# Patient Record
Sex: Male | Born: 1950 | Race: White | Hispanic: No | Marital: Married | Smoking: Never smoker
Health system: Southern US, Community
[De-identification: ages and names within clinical notes are randomized; demographics above are authoritative.]

## PROBLEM LIST (undated history)

## (undated) DIAGNOSIS — I10 Essential (primary) hypertension: Secondary | ICD-10-CM

## (undated) DIAGNOSIS — E785 Hyperlipidemia, unspecified: Secondary | ICD-10-CM

## (undated) HISTORY — PX: KIDNEY STONE SURGERY: SHX686

## (undated) HISTORY — PX: EYE SURGERY: SHX253

---

## 2002-09-23 ENCOUNTER — Ambulatory Visit (HOSPITAL_COMMUNITY): Admission: RE | Admit: 2002-09-23 | Discharge: 2002-09-23 | Payer: Self-pay | Admitting: Orthopedic Surgery

## 2002-09-23 ENCOUNTER — Encounter: Payer: Self-pay | Admitting: Orthopedic Surgery

## 2010-05-10 ENCOUNTER — Emergency Department (HOSPITAL_COMMUNITY)
Admission: EM | Admit: 2010-05-10 | Discharge: 2010-05-10 | Disposition: A | Discharge status: Home / Self Care | Payer: No Typology Code available for payment source | Attending: Emergency Medicine | Admitting: Emergency Medicine

## 2010-05-10 DIAGNOSIS — Z79899 Other long term (current) drug therapy: Secondary | ICD-10-CM | POA: Insufficient documentation

## 2010-05-10 DIAGNOSIS — R197 Diarrhea, unspecified: Secondary | ICD-10-CM | POA: Insufficient documentation

## 2010-05-10 DIAGNOSIS — I10 Essential (primary) hypertension: Secondary | ICD-10-CM | POA: Insufficient documentation

## 2010-05-10 DIAGNOSIS — E7800 Pure hypercholesterolemia, unspecified: Secondary | ICD-10-CM | POA: Insufficient documentation

## 2010-05-10 DIAGNOSIS — R55 Syncope and collapse: Secondary | ICD-10-CM | POA: Insufficient documentation

## 2010-05-10 DIAGNOSIS — R42 Dizziness and giddiness: Secondary | ICD-10-CM | POA: Insufficient documentation

## 2010-05-10 LAB — POCT I-STAT, CHEM 8
Glucose, Bld: 121 mg/dL — ABNORMAL HIGH (ref 70–99)
HCT: 39 % (ref 39.0–52.0)
Potassium: 3.7 mEq/L (ref 3.5–5.1)
TCO2: 21 mmol/L (ref 0–100)

## 2011-07-26 ENCOUNTER — Encounter (HOSPITAL_COMMUNITY): Payer: Self-pay | Admitting: Emergency Medicine

## 2011-07-26 ENCOUNTER — Emergency Department (HOSPITAL_COMMUNITY): Payer: No Typology Code available for payment source

## 2011-07-26 ENCOUNTER — Emergency Department (HOSPITAL_COMMUNITY)
Admission: EM | Admit: 2011-07-26 | Discharge: 2011-07-26 | Disposition: A | Discharge status: Home / Self Care | Payer: No Typology Code available for payment source | Attending: Emergency Medicine | Admitting: Emergency Medicine

## 2011-07-26 DIAGNOSIS — M549 Dorsalgia, unspecified: Secondary | ICD-10-CM | POA: Insufficient documentation

## 2011-07-26 DIAGNOSIS — I10 Essential (primary) hypertension: Secondary | ICD-10-CM | POA: Insufficient documentation

## 2011-07-26 DIAGNOSIS — R55 Syncope and collapse: Secondary | ICD-10-CM | POA: Insufficient documentation

## 2011-07-26 DIAGNOSIS — M25519 Pain in unspecified shoulder: Secondary | ICD-10-CM | POA: Insufficient documentation

## 2011-07-26 DIAGNOSIS — E785 Hyperlipidemia, unspecified: Secondary | ICD-10-CM | POA: Insufficient documentation

## 2011-07-26 HISTORY — DX: Essential (primary) hypertension: I10

## 2011-07-26 HISTORY — DX: Hyperlipidemia, unspecified: E78.5

## 2011-07-26 LAB — POCT I-STAT, CHEM 8
Calcium, Ion: 1.2 mmol/L (ref 1.12–1.32)
Chloride: 109 mEq/L (ref 96–112)
HCT: 42 % (ref 39.0–52.0)
Potassium: 3.6 mEq/L (ref 3.5–5.1)
Sodium: 145 mEq/L (ref 135–145)

## 2011-07-26 LAB — URINALYSIS, ROUTINE W REFLEX MICROSCOPIC
Glucose, UA: NEGATIVE mg/dL
Leukocytes, UA: NEGATIVE
Specific Gravity, Urine: 1.024 (ref 1.005–1.030)
pH: 5.5 (ref 5.0–8.0)

## 2011-07-26 LAB — CBC
Hemoglobin: 14.1 g/dL (ref 13.0–17.0)
MCH: 32.5 pg (ref 26.0–34.0)
MCV: 94.7 fL (ref 78.0–100.0)
RBC: 4.34 MIL/uL (ref 4.22–5.81)
WBC: 14.2 10*3/uL — ABNORMAL HIGH (ref 4.0–10.5)

## 2011-07-26 LAB — DIFFERENTIAL
Basophils Absolute: 0 10*3/uL (ref 0.0–0.1)
Eosinophils Relative: 3 % (ref 0–5)
Lymphocytes Relative: 12 % (ref 12–46)
Lymphs Abs: 1.6 10*3/uL (ref 0.7–4.0)

## 2011-07-26 LAB — GLUCOSE, CAPILLARY: Glucose-Capillary: 203 mg/dL — ABNORMAL HIGH (ref 70–99)

## 2011-07-26 MED ORDER — SODIUM CHLORIDE 0.9 % IV SOLN: INTRAVENOUS | Status: DC

## 2011-07-26 MED ORDER — SODIUM CHLORIDE 0.9 % IV SOLN
Freq: Once | INTRAVENOUS | Status: AC
  Administered 2011-07-26: 20:00:00 via INTRAVENOUS

## 2011-07-26 NOTE — ED Notes (Signed)
Hooked pt up to monitor and gave water .
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
## 2011-07-26 NOTE — ED Notes (Signed)
MD at bedside; PT not complaining of any pain; C collar and back board removed; pt very diaphoretic. Pt denies alcohol or drug use.

## 2011-07-26 NOTE — ED Notes (Signed)
PT ambulated with a steady gait; VSS; A&Ox3; no signs of distress; respirations even and unlabored; skin warm and dry; no questions.  

## 2011-07-26 NOTE — ED Notes (Signed)
Offered toileting and asked for urine sample. Pt states no need to urinate at present moment.

## 2011-07-26 NOTE — ED Notes (Signed)
Patient transported to X-ray 

## 2011-07-26 NOTE — ED Notes (Signed)
PT returned from XR; less diaphoretic; family at bedside.

## 2011-07-26 NOTE — ED Notes (Signed)
MD at bedside. 
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
## 2011-07-26 NOTE — ED Notes (Signed)
Urine specimen obtained.
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
## 2011-07-26 NOTE — ED Notes (Signed)
[employer] at bedside; pt is tearful but A&Ox3 with no signs of distress; family at bedside

## 2011-07-26 NOTE — ED Notes (Signed)
AIDET was performed. 

## 2014-11-01 ENCOUNTER — Encounter (HOSPITAL_BASED_OUTPATIENT_CLINIC_OR_DEPARTMENT_OTHER): Payer: Self-pay

## 2014-11-01 ENCOUNTER — Emergency Department (HOSPITAL_BASED_OUTPATIENT_CLINIC_OR_DEPARTMENT_OTHER): Payer: No Typology Code available for payment source

## 2014-11-01 ENCOUNTER — Emergency Department (HOSPITAL_BASED_OUTPATIENT_CLINIC_OR_DEPARTMENT_OTHER)
Admission: EM | Admit: 2014-11-01 | Discharge: 2014-11-01 | Disposition: A | Discharge status: Home / Self Care | Payer: No Typology Code available for payment source | Attending: Emergency Medicine | Admitting: Emergency Medicine

## 2014-11-01 DIAGNOSIS — Z79899 Other long term (current) drug therapy: Secondary | ICD-10-CM | POA: Insufficient documentation

## 2014-11-01 DIAGNOSIS — S32000A Wedge compression fracture of unspecified lumbar vertebra, initial encounter for closed fracture: Secondary | ICD-10-CM

## 2014-11-01 DIAGNOSIS — S32050A Wedge compression fracture of fifth lumbar vertebra, initial encounter for closed fracture: Secondary | ICD-10-CM | POA: Diagnosis not present

## 2014-11-01 DIAGNOSIS — W14XXXA Fall from tree, initial encounter: Secondary | ICD-10-CM | POA: Diagnosis not present

## 2014-11-01 DIAGNOSIS — Y990 Civilian activity done for income or pay: Secondary | ICD-10-CM | POA: Insufficient documentation

## 2014-11-01 DIAGNOSIS — Y9389 Activity, other specified: Secondary | ICD-10-CM | POA: Diagnosis not present

## 2014-11-01 DIAGNOSIS — I10 Essential (primary) hypertension: Secondary | ICD-10-CM | POA: Diagnosis not present

## 2014-11-01 DIAGNOSIS — Y9289 Other specified places as the place of occurrence of the external cause: Secondary | ICD-10-CM | POA: Diagnosis not present

## 2014-11-01 DIAGNOSIS — W19XXXA Unspecified fall, initial encounter: Secondary | ICD-10-CM

## 2014-11-01 DIAGNOSIS — E785 Hyperlipidemia, unspecified: Secondary | ICD-10-CM | POA: Insufficient documentation

## 2014-11-01 DIAGNOSIS — S3992XA Unspecified injury of lower back, initial encounter: Secondary | ICD-10-CM | POA: Diagnosis present

## 2014-11-01 LAB — BASIC METABOLIC PANEL
ANION GAP: 8 (ref 5–15)
BUN: 17 mg/dL (ref 6–20)
CALCIUM: 9.1 mg/dL (ref 8.9–10.3)
CO2: 26 mmol/L (ref 22–32)
Chloride: 106 mmol/L (ref 101–111)
Creatinine, Ser: 1.01 mg/dL (ref 0.61–1.24)
Glucose, Bld: 99 mg/dL (ref 65–99)
POTASSIUM: 3.9 mmol/L (ref 3.5–5.1)
SODIUM: 140 mmol/L (ref 135–145)

## 2014-11-01 LAB — URINALYSIS, ROUTINE W REFLEX MICROSCOPIC
Bilirubin Urine: NEGATIVE
GLUCOSE, UA: NEGATIVE mg/dL
Hgb urine dipstick: NEGATIVE
KETONES UR: NEGATIVE mg/dL
LEUKOCYTES UA: NEGATIVE
NITRITE: NEGATIVE
PROTEIN: NEGATIVE mg/dL
Specific Gravity, Urine: >1.046 — ABNORMAL HIGH (ref 1.005–1.030)
Urobilinogen, UA: 1 mg/dL (ref 0.0–1.0)
pH: 5 (ref 5.0–8.0)

## 2014-11-01 MED ORDER — ONDANSETRON HCL 4 MG/2ML IJ SOLN
4.0000 mg | Freq: Once | INTRAMUSCULAR | Status: AC
  Administered 2014-11-01: 4 mg via INTRAVENOUS
  Filled 2014-11-01: qty 2

## 2014-11-01 MED ORDER — HYDROMORPHONE HCL 1 MG/ML IJ SOLN
0.5000 mg | Freq: Once | INTRAMUSCULAR | Status: AC
  Administered 2014-11-01: 0.5 mg via INTRAVENOUS
  Filled 2014-11-01: qty 1

## 2014-11-01 MED ORDER — IOHEXOL 300 MG/ML  SOLN
100.0000 mL | Freq: Once | INTRAMUSCULAR | Status: AC | PRN
  Administered 2014-11-01: 100 mL via INTRAVENOUS

## 2014-11-01 MED ORDER — OXYCODONE-ACETAMINOPHEN 5-325 MG PO TABS: 1.0000 | ORAL_TABLET | ORAL | Status: AC | PRN

## 2014-11-01 MED ORDER — SODIUM CHLORIDE 0.9 % IV BOLUS (SEPSIS)
500.0000 mL | Freq: Once | INTRAVENOUS | Status: AC
  Administered 2014-11-01: 500 mL via INTRAVENOUS

## 2014-11-01 NOTE — Discharge Instructions (Signed)
Please read and follow all provided instructions.  Your diagnoses today include:  1. Lumbar compression fracture, closed, initial encounter   2. Fall     Tests performed today include:  Vital signs - see below for your results today  Electrolytes and kidney function  CT imaging of your chest, abdomen, pelvis, and spine - shows compression fracture of L1 vertebrae as well as several other areas that need to be monitored by your doctor  Medications prescribed:   Percocet (oxycodone/acetaminophen) - narcotic pain medication  DO NOT drive or perform any activities that require you to be awake and alert because this medicine can make you drowsy. BE VERY CAREFUL not to take multiple medicines containing Tylenol (also called acetaminophen). Doing so can lead to an overdose which can damage your liver and cause liver failure and possibly death.  Take any prescribed medications only as directed.  Home care instructions:   Follow any educational materials contained in this packet  Please rest, use ice or heat on your back for the next several days  Do not lift, push, pull anything more than 10 pounds until cleared by your doctor  Follow-up instructions: Please follow-up with your primary care provider in the next 1 week for further evaluation of your symptoms.   Return instructions:  SEEK IMMEDIATE MEDICAL ATTENTION IF YOU HAVE:  New numbness, tingling, weakness, or problem with the use of your arms or legs  Severe back pain not relieved with medications  Loss control of your bowels or bladder  Increasing pain in any areas of the body (such as chest or abdominal pain)  Shortness of breath, dizziness, or fainting.   Worsening nausea (feeling sick to your stomach), vomiting, fever, or sweats  Any other emergent concerns regarding your health   Additional Information:  Your vital signs today were: BP 134/84 mmHg   Pulse 56   Temp(Src) 98.5 F (36.9 C) (Oral)   Resp 18   Ht   (1.727 m)   Wt 175 lb (79.379 kg)   BMI 26.61 kg/m2   SpO2 100% If your blood pressure (BP) was elevated above 135/85 this visit, please have this repeated by your doctor within one month. --------------

## 2014-11-01 NOTE — ED Provider Notes (Signed)
CSN: [id number]     Arrival date & time 11/01/14  1416 History   First MD Initiated Contact with Patient 11/01/14 1441     Chief Complaint  Patient presents with  . Fall     (Consider location/radiation/quality/duration/timing/severity/associated sxs/prior Treatment) HPI Comments: Patient presents after an approximately 20 foot fall from a tree stand this morning at approximately 8:30 AM. Patient was working on this tree stand when part of it gave way and he fell to the ground landing on his back. Patient denies loss of consciousness. He had the wind knocked out of him but was able to get up and walk back to his house. Patient had acute onset of lower back pain as well as left posterior rib pain. No shortness of breath or difficulty breathing. No chest pain. No abdominal pain. Urine has been normal without blood. No weakness, numbness, or tingling in arms or legs. Patient denies headache, blurry vision, vomiting. No neck pain. No lightheadedness or syncope. He has been ambulatory without difficulty. Patient took a shower without difficulty at home. Due to persistent pain, he decided to come to the emergency department.  The history is provided by the patient.    Past Medical History  Diagnosis Date  . Hypertension   . Hyperlipidemia    Past Surgical History  Procedure Laterality Date  . Kidney stone surgery    . Eye surgery     No family history on file. Social History  Substance Use Topics  . Smoking status: Never Smoker   . Smokeless tobacco: None  . Alcohol Use: No    Review of Systems  Constitutional: Negative for fatigue.  HENT: Negative for tinnitus.   Eyes: Negative for photophobia, pain and visual disturbance.  Respiratory: Negative for shortness of breath.   Cardiovascular: Negative for chest pain.  Gastrointestinal: Negative for nausea and vomiting.  Genitourinary: Negative for hematuria and flank pain.  Musculoskeletal: Positive for back pain. Negative for gait  problem and neck pain.  Skin: Negative for wound.  Neurological: Negative for dizziness, weakness, light-headedness, numbness and headaches.  Psychiatric/Behavioral: Negative for confusion and decreased concentration.      Allergies  Codeine and Naproxen  Home Medications   Prior to Admission medications   Medication Sig Start Date End Date Taking? Authorizing Provider  FLUoxetine (PROZAC) 40 MG capsule Take 40 mg by mouth daily.   Yes Historical Provider, MD  lisinopril (PRINIVIL,ZESTRIL) 20 MG tablet Take 20 mg by mouth daily.   Yes Historical Provider, MD  Rosuvastatin Calcium (CRESTOR PO) Take 1 tablet by mouth at bedtime.   Yes Historical Provider, MD  aspirin EC 81 MG tablet Take 81 mg by mouth at bedtime.    Historical Provider, MD  ibuprofen (ADVIL,MOTRIN) 200 MG tablet Take 600-800 mg by mouth at bedtime.    Historical Provider, MD  PRESCRIPTION MEDICATION Take 1 tablet by mouth at bedtime. Blood pressure medication.    Historical Provider, MD  PRESCRIPTION MEDICATION Take 1 tablet by mouth at bedtime. Antidepressant medication.    Historical Provider, MD   BP 98/68 mmHg  Pulse 66  Temp(Src) 98.5 F (36.9 C) (Oral)  Resp 20  Ht 5\' 8"  (1.727 m)  Wt 175 lb (79.379 kg)  BMI 26.61 kg/m2  SpO2 97% Physical Exam  Constitutional: He is oriented to person, place, and time. He appears well-developed and well-nourished.  HENT:  Head: Normocephalic and atraumatic. Head is without raccoon's eyes and without Battle's sign.  Right Ear: Tympanic membrane, external ear  and ear canal normal. No hemotympanum.  Left Ear: Tympanic membrane, external ear and ear canal normal. No hemotympanum.  Nose: Nose normal. No nasal septal hematoma.  Mouth/Throat: Oropharynx is clear and moist.  Eyes: Conjunctivae, EOM and lids are normal. Pupils are equal, round, and reactive to light. Right eye exhibits no discharge. Left eye exhibits no discharge.  No visible hyphema  Neck: Normal range of  motion. Neck supple.  Cardiovascular: Normal rate, regular rhythm and normal heart sounds.   No murmur heard. Pulmonary/Chest: Effort normal and breath sounds normal. No respiratory distress. He has no wheezes. He has no rales. He exhibits tenderness (Tenderness with palpation over the lower sternum.).  Normal air movement.  Abdominal: Soft. Bowel sounds are normal. There is no tenderness. There is no rebound and no guarding.  No tenderness, rebound or guarding. No bruising suspicious for retroperitoneal hematoma.  Musculoskeletal: He exhibits tenderness. He exhibits no edema.       Right shoulder: Normal.       Left shoulder: Normal.       Right elbow: Normal.      Left elbow: Normal.       Right wrist: Normal.       Left wrist: Normal.       Right hip: Normal.       Left hip: Normal.       Right knee: Normal.       Left knee: Normal.       Right ankle: Normal.       Left ankle: Normal.       Cervical back: He exhibits normal range of motion, no tenderness and no bony tenderness.       Thoracic back: He exhibits tenderness. He exhibits no bony tenderness.       Lumbar back: He exhibits tenderness. He exhibits no bony tenderness.       Back:       Right foot: Normal.       Left foot: Normal.  Neurological: He is alert and oriented to person, place, and time. He has normal strength and normal reflexes. No cranial nerve deficit or sensory deficit. Coordination normal. GCS eye subscore is 4. GCS verbal subscore is 5. GCS motor subscore is 6.  Skin: Skin is warm and dry.  Psychiatric: He has a normal mood and affect.  Nursing note and vitals reviewed.   ED Course  Procedures (including critical care time) Labs Review Labs Reviewed  URINALYSIS, ROUTINE W REFLEX MICROSCOPIC (NOT AT [facility]) - Abnormal; Notable for the following:    Specific Gravity, Urine >1.046 (*)    All other components within normal limits  BASIC METABOLIC PANEL    Imaging Review Dg Ribs Unilateral W/chest  Left  11/01/2014   CLINICAL DATA:  Status post fall from 20 feet the years and with lower rib pain.  EXAM: LEFT RIBS AND CHEST - 3+ VIEW  COMPARISON:  None.  FINDINGS: No fracture or other bone lesions are seen involving the ribs. There is no evidence of pneumothorax or pleural effusion. Both lungs are clear. Heart size and mediastinal contours are within normal limits.  IMPRESSION: Negative.   Electronically Signed   By: Sherian Rein M.D.   On: 11/01/2014 15:25   Dg Thoracic Spine 2 View  11/01/2014   CLINICAL DATA:  Larey Seat 15-20 feet from deer stand. Mid left posterior lower rib pain, low back pain.  EXAM: THORACIC SPINE 2 VIEWS  COMPARISON:  07/26/2011  FINDINGS: There is a  mild compression fracture through the superior endplate of L1, better seen on lumbar spine series. No thoracic spine abnormality.  IMPRESSION: Mild L1 compression fracture.   Electronically Signed   By: Charlett Nose M.D.   On: 11/01/2014 15:23   Dg Lumbar Spine Complete  11/01/2014   CLINICAL DATA:  Back pain and right posterior lower rib pain after falling 15-20 feet from a deer stand.  EXAM: LUMBAR SPINE - COMPLETE 4+ VIEW  COMPARISON:  None.  FINDINGS: There is an acute compression fracture of the superior aspect of L1. There is no visible protrusion of bone into the spinal canal. Anterior vertebral body of height loss is approximately 35%.  No other significant abnormality of the lumbar spine.  IMPRESSION: Acute compression fracture of the superior aspect of L1. No visible protrusion of bone into the spinal canal.   Electronically Signed   By: Francene Boyers M.D.   On: 11/01/2014 15:25   Ct Chest W Contrast  11/01/2014   CLINICAL DATA:  Fall 20 feet from deer stand with mid to low back pain and left posterior lower rib pain.  EXAM: CT CHEST, ABDOMEN, AND PELVIS WITH CONTRAST  TECHNIQUE: Multidetector CT imaging of the chest, abdomen and pelvis was performed following the standard protocol during bolus administration of intravenous  contrast.  CONTRAST:  OMNIPAQUE IOHEXOL 300 MG/ML  SOLN  COMPARISON:  None.  FINDINGS: CT CHEST FINDINGS  Lungs demonstrate minimal bibasilar dependent atelectasis. No evidence of effusion or pneumothorax. Airways are within normal.  Heart is normal in size. Vascular structures are within normal. No evidence of hilar or mediastinal adenopathy. Remaining mediastinal structures are normal.  Remaining bones and soft tissues over the thorax are within normal.  CT ABDOMEN AND PELVIS FINDINGS  There is evidence of cholelithiasis. The liver, spleen, pancreas and left adrenal gland are normal. Kidneys are normal in size without hydronephrosis or nephrolithiasis. Left kidney is normal. There is a sub cm hypodensity over the upper pole the right kidney as well as a 1 cm hypodensity over the mid to upper pole the right kidney too small to characterize but likely cysts. Appendix is normal.  There is a 1.7 cm right adrenal nodule indeterminate, but likely an adenoma.  There is no free fluid or free peritoneal air. Subtle calcified plaque over the distal abdominal aorta.  Mild diverticulosis of the colon.  Small bowel is within normal.  Few prominent lymph nodes over the gastrohepatic ligament and celiac axis as well as portacaval region with the largest in the portacaval region measuring 2.2 cm by short axis.  Pelvic images demonstrate the bladder, prostate and rectum to be within normal. No free fluid.  There is an acute mild compression fracture of L1 without retropulsion or fragments within the canal.  IMPRESSION: Acute mild L1 compression fracture. No evidence of retropulsion or fragments within the canal.  No acute findings within chest.  No acute solid abdominal organ injury.  Cholelithiasis.  Indeterminate 1.7 cm right adrenal nodule likely an adenoma. Recommend followup pre and post-contrast CT 1 year.  1 cm and sub cm right renal cortical hypodensities too small characterize but likely cysts.  Minimal  diverticulosis of the colon.  Mild nonspecific adenopathy over the upper abdomen most prominent with the celiac axis and portacaval region. Recommend clinical correlation and followup CT 3 months.  These results were called by telephone at the time of interpretation on 11/01/2014 at 5:37 pm to Dr. Lowella Bandy , who verbally acknowledged these results.  Electronically Signed   By: Elberta Fortis M.D.   On: 11/01/2014 17:34   Ct Abdomen Pelvis W Contrast  11/01/2014   CLINICAL DATA:  Fall 20 feet from deer stand with mid to low back pain and left posterior lower rib pain.  EXAM: CT CHEST, ABDOMEN, AND PELVIS WITH CONTRAST  TECHNIQUE: Multidetector CT imaging of the chest, abdomen and pelvis was performed following the standard protocol during bolus administration of intravenous contrast.  CONTRAST:  OMNIPAQUE IOHEXOL 300 MG/ML  SOLN  COMPARISON:  None.  FINDINGS: CT CHEST FINDINGS  Lungs demonstrate minimal bibasilar dependent atelectasis. No evidence of effusion or pneumothorax. Airways are within normal.  Heart is normal in size. Vascular structures are within normal. No evidence of hilar or mediastinal adenopathy. Remaining mediastinal structures are normal.  Remaining bones and soft tissues over the thorax are within normal.  CT ABDOMEN AND PELVIS FINDINGS  There is evidence of cholelithiasis. The liver, spleen, pancreas and left adrenal gland are normal. Kidneys are normal in size without hydronephrosis or nephrolithiasis. Left kidney is normal. There is a sub cm hypodensity over the upper pole the right kidney as well as a 1 cm hypodensity over the mid to upper pole the right kidney too small to characterize but likely cysts. Appendix is normal.  There is a 1.7 cm right adrenal nodule indeterminate, but likely an adenoma.  There is no free fluid or free peritoneal air. Subtle calcified plaque over the distal abdominal aorta.  Mild diverticulosis of the colon.  Small bowel is within normal.  Few prominent  lymph nodes over the gastrohepatic ligament and celiac axis as well as portacaval region with the largest in the portacaval region measuring 2.2 cm by short axis.  Pelvic images demonstrate the bladder, prostate and rectum to be within normal. No free fluid.  There is an acute mild compression fracture of L1 without retropulsion or fragments within the canal.  IMPRESSION: Acute mild L1 compression fracture. No evidence of retropulsion or fragments within the canal.  No acute findings within chest.  No acute solid abdominal organ injury.  Cholelithiasis.  Indeterminate 1.7 cm right adrenal nodule likely an adenoma. Recommend followup pre and post-contrast CT 1 year.  1 cm and sub cm right renal cortical hypodensities too small characterize but likely cysts.  Minimal diverticulosis of the colon.  Mild nonspecific adenopathy over the upper abdomen most prominent with the celiac axis and portacaval region. Recommend clinical correlation and followup CT 3 months.  These results were called by telephone at the time of interpretation on 11/01/2014 at 5:37 pm to Dr. Lowella Bandy , who verbally acknowledged these results.   Electronically Signed   By: Elberta Fortis M.D.   On: 11/01/2014 17:34   I have personally reviewed and evaluated these images and lab results as part of my medical decision-making.   EKG Interpretation None      2:51 PM Patient seen and examined. Work-up initiated. Medications ordered. BP noted, will recheck. Patient does not have clinical signs of blood loss. No abd or chest pain other than focal back and rib pain. No thigh pain or swelling. Pelvis stable without pain. Injury occurred > 6 hrs ago. Patient appears very well.   Patient seen with Dr. Rubin Payor.   Vital signs reviewed and are as follows: BP 98/68 mmHg  Pulse 66  Temp(Src) 98.5 F (36.9 C) (Oral)  Resp 20  Ht 5\' 8"  (1.727 m)  Wt 175 lb (79.379 kg)  BMI 26.61 kg/m2  SpO2 97%   4:44 PM Given lumbar compression fracture. Will  CT chest/abd/pelvis. Patient continues to have pain over sternum. Pain medication given.   6:40 PM CT results as above. Patient updated. Pain controlled in ED. Will discharge to home with pain medicine. Patient to follow-up incidental CT results with PCP.  Patient counseled on use of narcotic pain medications. Counseled not to combine these medications with others containing tylenol. Urged not to drink alcohol, drive, or perform any other activities that requires focus while taking these medications. The patient verbalizes understanding and agrees with the plan.  Patient to return with worsening uncontrolled pain, weakness in legs, numbness or tingling, new symptoms or other concerns.    MDM   Final diagnoses:  Lumbar compression fracture, closed, initial encounter   Patient with 20 foot fall onto back. Complaint of back and rib pain. Initial imaging demonstrated lumbar compression fracture. Normal neurological exam. Given fracture, CT scan of chest abdomen and pelvis performed to further evaluate spinal fracture as well as ensure no internal injuries. These did not demonstrate any other acute injuries. Patient discharged home with pain control, neurosurg f/u. Patient to follow-up with his PCP as well.  Renne Crigler, PA-C 11/01/14 1843  Arby Barrette, MD 11/02/14 [phone]

## 2014-11-01 NOTE — ED Notes (Signed)
Reports fell 34ft out of tree stand this am and landed on back.  Complains of left hip pain, pelvis pain and left rib pain.  Reports when he moves he feels a clicking in his chest.

## 2014-11-01 NOTE — ED Notes (Signed)
Patient transported to CT 

## 2014-11-01 NOTE — ED Notes (Signed)
PA at bedside.

## 2014-11-01 NOTE — ED Notes (Signed)
D/c home with family to drive- Rx x 1 given for percocet

## 2014-11-01 NOTE — ED Notes (Signed)
Pt refused wheelchair.  

## 2016-02-08 IMAGING — CR DG LUMBAR SPINE COMPLETE 4+V
5 series · 5 of 5 positions shown · non-contrast
Comparison: None.

CLINICAL DATA: Back pain and right posterior lower rib pain after
falling 15-20 feet from [place].

EXAM:
LUMBAR SPINE - COMPLETE 4+ VIEW

[t l-spine a.p.]
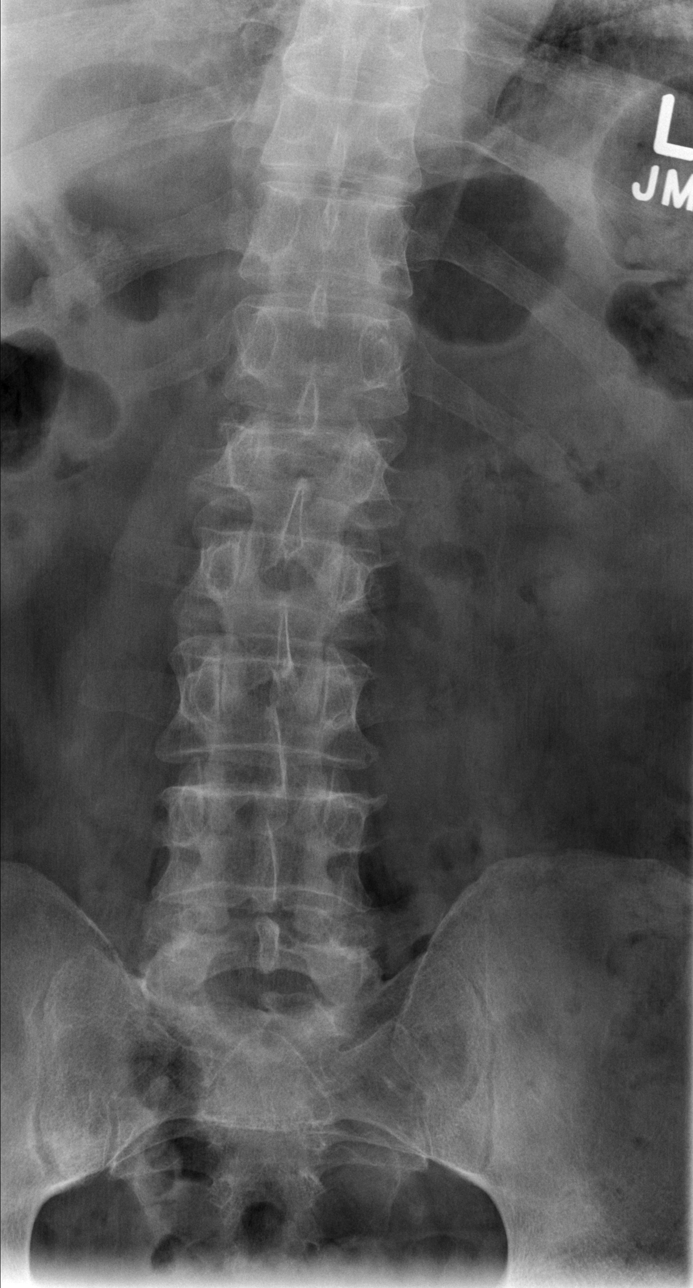

[t l-spine oblique exposure (1 of 2)]
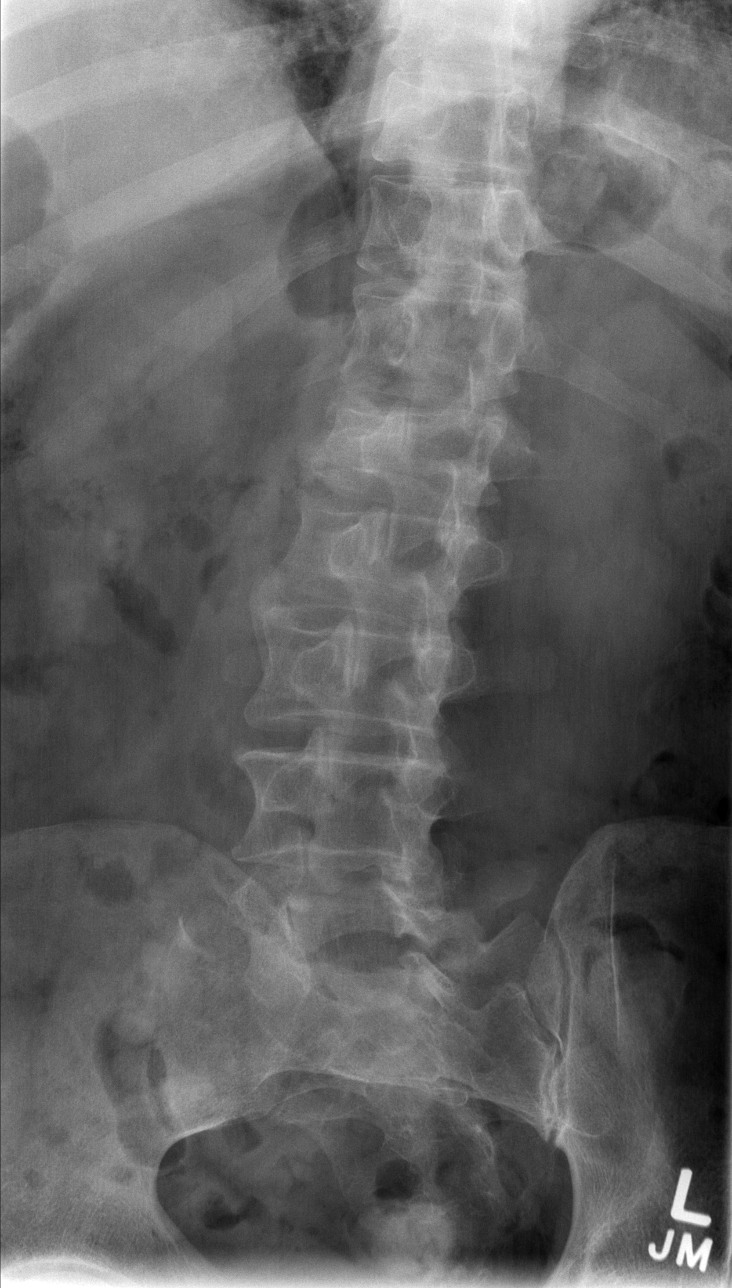

[t l-spine oblique exposure (2 of 2)]
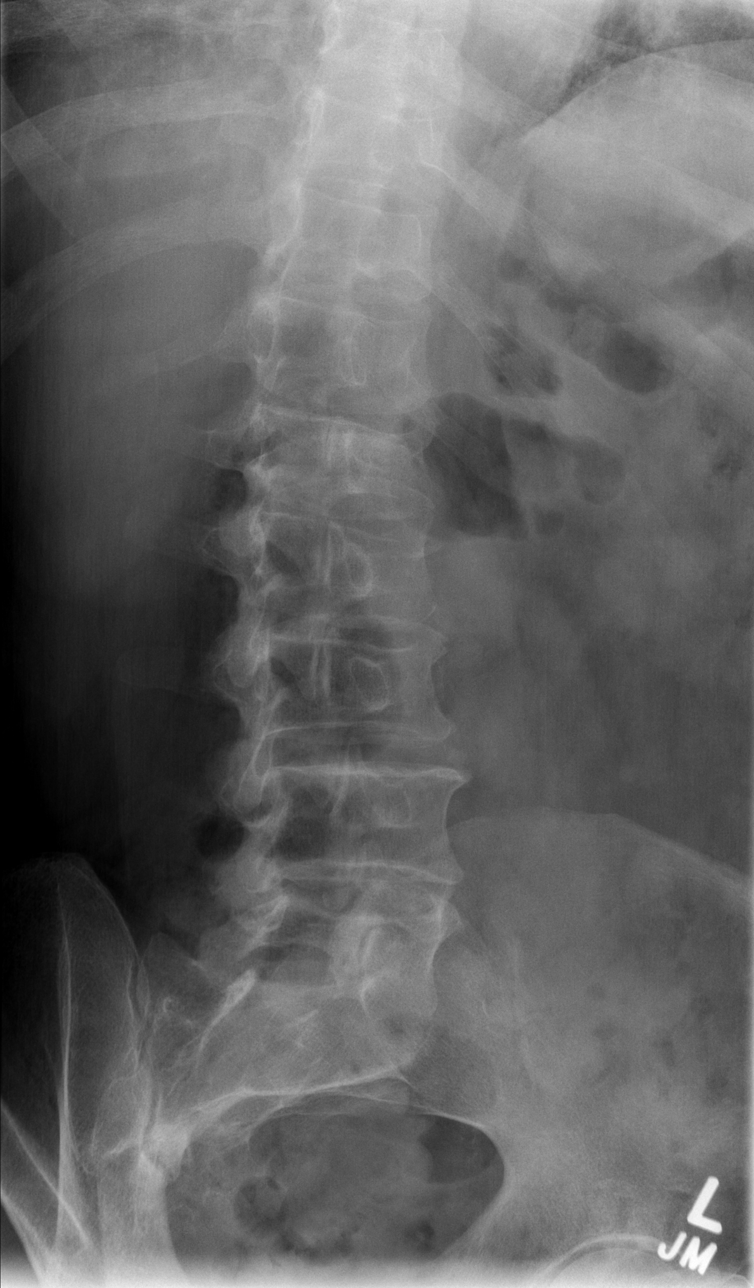

[t l-spine lat]
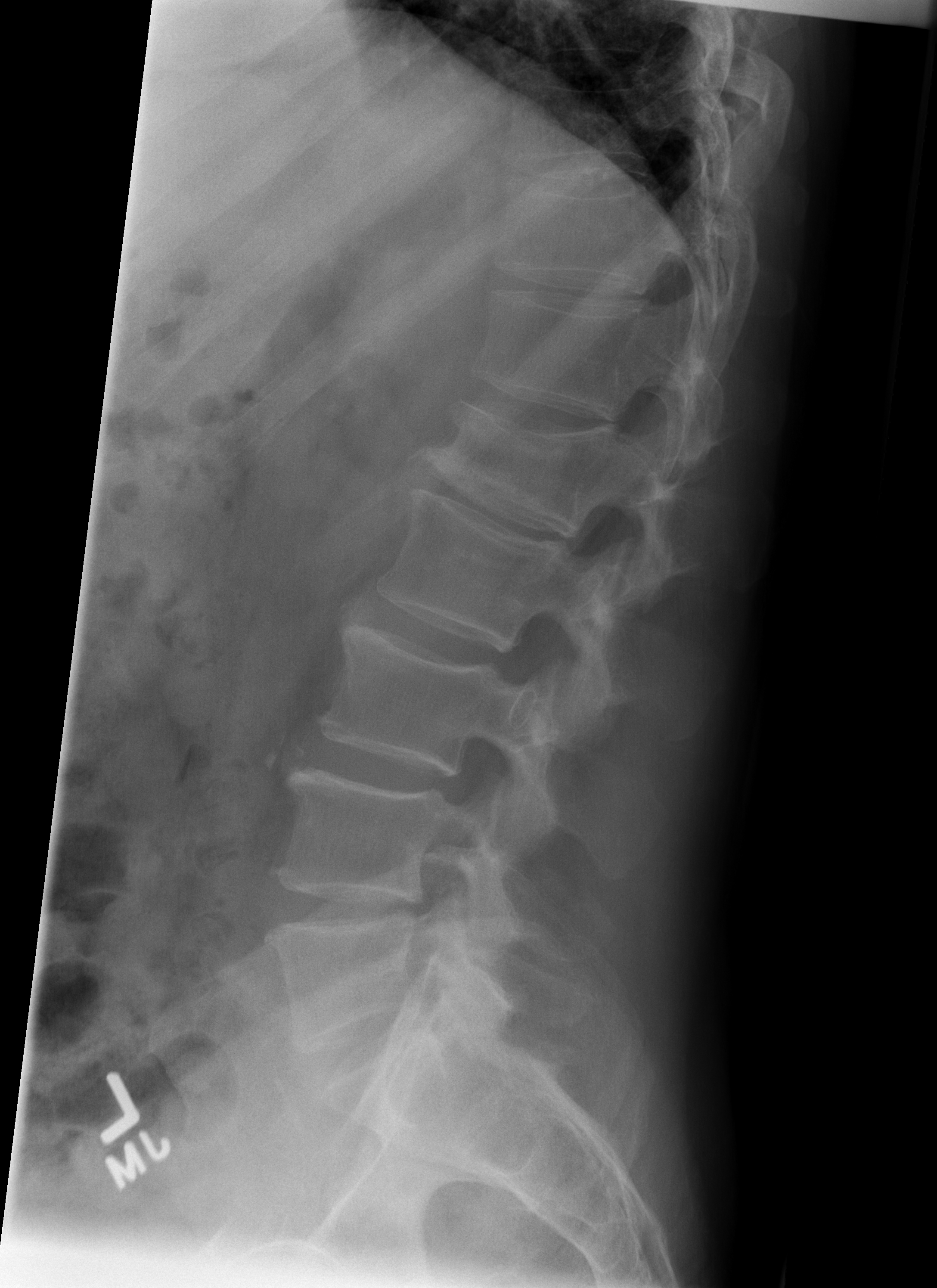

[t l-spine l5-s1 spot]
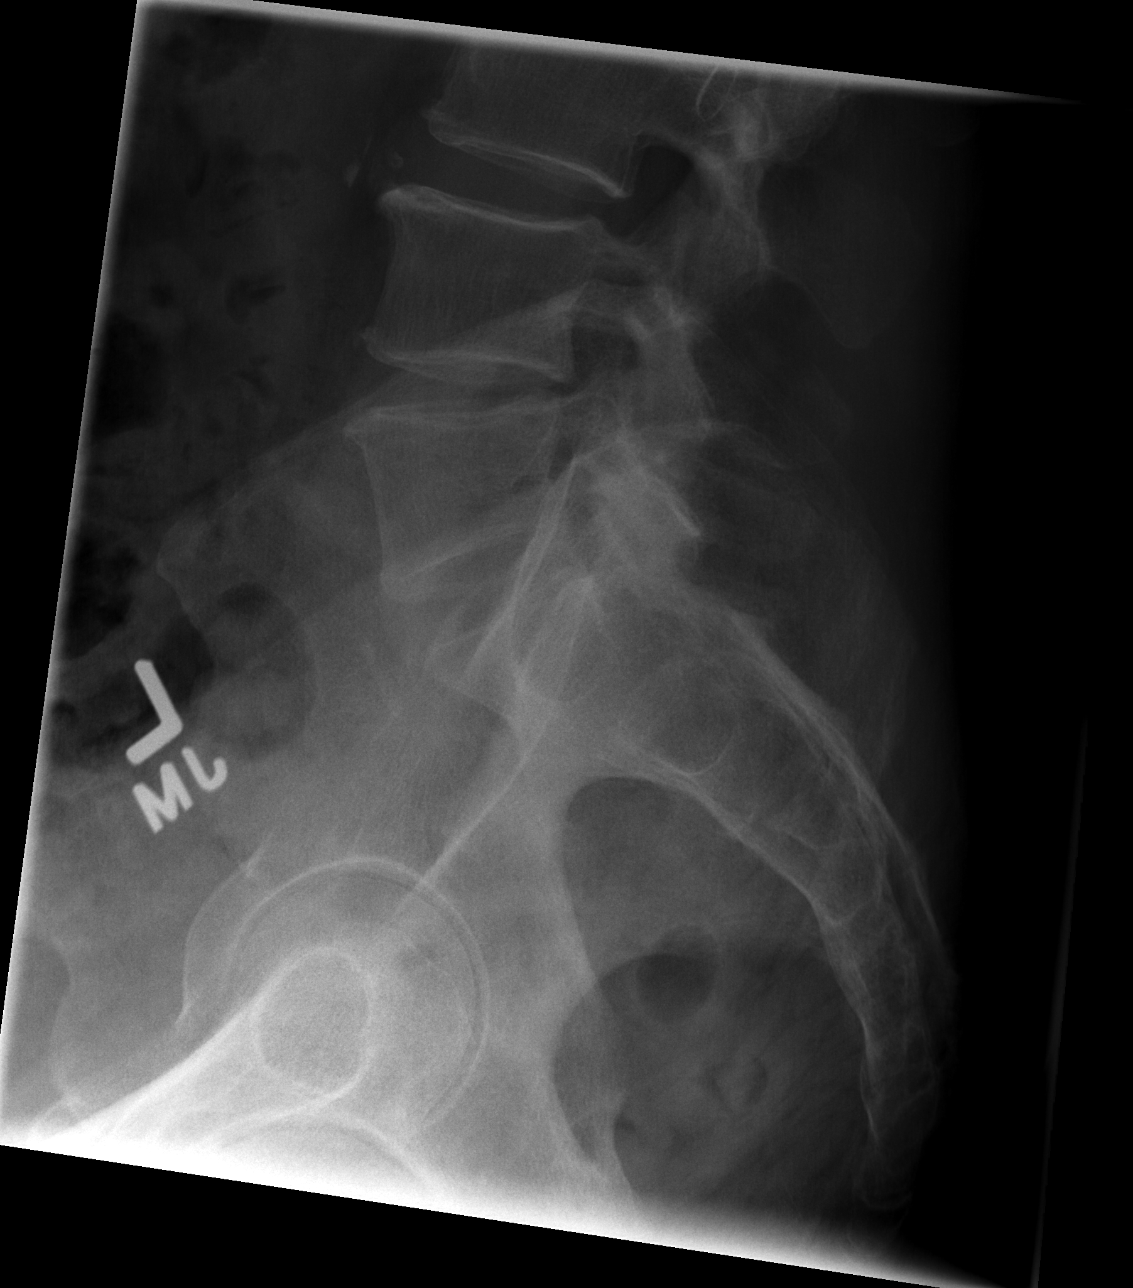

[5 of 5 positions shown; findings below may reference images not displayed]

FINDINGS: There is an acute compression fracture of the superior aspect of L1.
There is no visible protrusion of bone into the spinal canal.
Anterior vertebral body of height loss is approximately 35%.

No other significant abnormality of the lumbar spine.
IMPRESSION: Acute compression fracture of the superior aspect of L1. No visible
protrusion of bone into the spinal canal.
